# Patient Record
Sex: Male | Born: 1980 | Race: White | Hispanic: No | Marital: Married | Smoking: Never smoker
Health system: Southern US, Community
[De-identification: ages and names within clinical notes are randomized; demographics above are authoritative.]

---

## 2016-03-09 ENCOUNTER — Encounter: Payer: Self-pay | Admitting: Emergency Medicine

## 2016-03-09 ENCOUNTER — Emergency Department: Payer: 59

## 2016-03-09 ENCOUNTER — Emergency Department
Admission: EM | Admit: 2016-03-09 | Discharge: 2016-03-09 | Payer: 59 | Attending: Emergency Medicine | Admitting: Emergency Medicine

## 2016-03-09 DIAGNOSIS — S0990XA Unspecified injury of head, initial encounter: Secondary | ICD-10-CM | POA: Diagnosis not present

## 2016-03-09 DIAGNOSIS — S82831A Other fracture of upper and lower end of right fibula, initial encounter for closed fracture: Secondary | ICD-10-CM | POA: Diagnosis not present

## 2016-03-09 DIAGNOSIS — S82251A Displaced comminuted fracture of shaft of right tibia, initial encounter for closed fracture: Secondary | ICD-10-CM | POA: Insufficient documentation

## 2016-03-09 DIAGNOSIS — R1012 Left upper quadrant pain: Secondary | ICD-10-CM | POA: Diagnosis not present

## 2016-03-09 DIAGNOSIS — R0781 Pleurodynia: Secondary | ICD-10-CM | POA: Diagnosis not present

## 2016-03-09 DIAGNOSIS — Y92410 Unspecified street and highway as the place of occurrence of the external cause: Secondary | ICD-10-CM | POA: Insufficient documentation

## 2016-03-09 DIAGNOSIS — S82141A Displaced bicondylar fracture of right tibia, initial encounter for closed fracture: Secondary | ICD-10-CM

## 2016-03-09 DIAGNOSIS — Y999 Unspecified external cause status: Secondary | ICD-10-CM | POA: Diagnosis not present

## 2016-03-09 DIAGNOSIS — Y9389 Activity, other specified: Secondary | ICD-10-CM | POA: Diagnosis not present

## 2016-03-09 DIAGNOSIS — S8291XA Unspecified fracture of right lower leg, initial encounter for closed fracture: Secondary | ICD-10-CM | POA: Diagnosis present

## 2016-03-09 DIAGNOSIS — S82401A Unspecified fracture of shaft of right fibula, initial encounter for closed fracture: Secondary | ICD-10-CM

## 2016-03-09 LAB — COMPREHENSIVE METABOLIC PANEL
ALT: 20 U/L (ref 17–63)
AST: 29 U/L (ref 15–41)
Albumin: 4.8 g/dL (ref 3.5–5.0)
Alkaline Phosphatase: 45 U/L (ref 38–126)
Anion gap: 8 (ref 5–15)
BILIRUBIN TOTAL: 0.6 mg/dL (ref 0.3–1.2)
BUN: 17 mg/dL (ref 6–20)
CO2: 24 mmol/L (ref 22–32)
CREATININE: 1.13 mg/dL (ref 0.61–1.24)
Calcium: 9.6 mg/dL (ref 8.9–10.3)
Chloride: 106 mmol/L (ref 101–111)
GFR calc Af Amer: >60 mL/min (ref >60)
Glucose, Bld: 120 mg/dL — ABNORMAL HIGH (ref 65–99)
POTASSIUM: 3.4 mmol/L — AB (ref 3.5–5.1)
Sodium: 138 mmol/L (ref 135–145)
Total Protein: 7.6 g/dL (ref 6.5–8.1)

## 2016-03-09 LAB — CBC WITH DIFFERENTIAL/PLATELET
BASOS ABS: 0.1 10*3/uL (ref 0–0.1)
Basophils Relative: 0 %
Eosinophils Absolute: 0.1 10*3/uL (ref 0–0.7)
Eosinophils Relative: 0 %
HEMATOCRIT: 44 % (ref 40.0–52.0)
Hemoglobin: 15.1 g/dL (ref 13.0–18.0)
LYMPHS ABS: 2.2 10*3/uL (ref 1.0–3.6)
LYMPHS PCT: 14 %
MCH: 33.1 pg (ref 26.0–34.0)
MCHC: 34.3 g/dL (ref 32.0–36.0)
MCV: 96.4 fL (ref 80.0–100.0)
MONO ABS: 1.1 10*3/uL — AB (ref 0.2–1.0)
MONOS PCT: 7 %
NEUTROS ABS: 12.7 10*3/uL — AB (ref 1.4–6.5)
Neutrophils Relative %: 79 %
Platelets: 266 10*3/uL (ref 150–440)
RBC: 4.56 MIL/uL (ref 4.40–5.90)
RDW: 12.4 % (ref 11.5–14.5)
WBC: 16.2 10*3/uL — ABNORMAL HIGH (ref 3.8–10.6)

## 2016-03-09 LAB — TYPE AND SCREEN
ABO/RH(D): A POS
Antibody Screen: NEGATIVE

## 2016-03-09 LAB — LIPASE, BLOOD: LIPASE: 20 U/L (ref 11–51)

## 2016-03-09 MED ORDER — HYDROMORPHONE HCL 1 MG/ML IJ SOLN
1.0000 mg | Freq: Once | INTRAMUSCULAR | Status: AC
  Administered 2016-03-09: 1 mg via INTRAVENOUS
  Filled 2016-03-09: qty 1

## 2016-03-09 MED ORDER — ONDANSETRON HCL 4 MG/2ML IJ SOLN
4.0000 mg | Freq: Once | INTRAMUSCULAR | Status: AC
  Administered 2016-03-09: 4 mg via INTRAVENOUS
  Filled 2016-03-09: qty 2

## 2016-03-09 MED ORDER — MORPHINE SULFATE (PF) 4 MG/ML IV SOLN
INTRAVENOUS | Status: AC
  Administered 2016-03-09: 4 mg via INTRAVENOUS
  Filled 2016-03-09: qty 1

## 2016-03-09 MED ORDER — IOPAMIDOL (ISOVUE-300) INJECTION 61%
100.0000 mL | Freq: Once | INTRAVENOUS | Status: AC | PRN
  Administered 2016-03-09: 100 mL via INTRAVENOUS

## 2016-03-09 MED ORDER — HYDROMORPHONE HCL 1 MG/ML IJ SOLN
INTRAMUSCULAR | Status: AC
  Administered 2016-03-09: 0.5 mg via INTRAVENOUS
  Filled 2016-03-09: qty 1

## 2016-03-09 MED ORDER — SODIUM CHLORIDE 0.9 % IV BOLUS (SEPSIS)
1000.0000 mL | Freq: Once | INTRAVENOUS | Status: AC
  Administered 2016-03-09: 1000 mL via INTRAVENOUS

## 2016-03-09 MED ORDER — MORPHINE SULFATE (PF) 4 MG/ML IV SOLN
4.0000 mg | Freq: Once | INTRAVENOUS | Status: AC
  Administered 2016-03-09: 4 mg via INTRAVENOUS
  Filled 2016-03-09: qty 1

## 2016-03-09 MED ORDER — HYDROMORPHONE HCL 1 MG/ML IJ SOLN
0.5000 mg | Freq: Once | INTRAMUSCULAR | Status: AC
  Administered 2016-03-09: 0.5 mg via INTRAVENOUS

## 2016-03-09 MED ORDER — MORPHINE SULFATE (PF) 4 MG/ML IV SOLN
4.0000 mg | Freq: Once | INTRAVENOUS | Status: AC
  Administered 2016-03-09: 4 mg via INTRAVENOUS
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
## 2016-03-09 NOTE — ED Notes (Signed)
Splint applied
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
## 2024-06-17 ENCOUNTER — Emergency Department (HOSPITAL_COMMUNITY)

## 2024-06-17 ENCOUNTER — Other Ambulatory Visit: Payer: Self-pay

## 2024-06-17 ENCOUNTER — Emergency Department (HOSPITAL_COMMUNITY)
Admission: EM | Admit: 2024-06-17 | Discharge: 2024-06-17 | Disposition: A | Discharge status: Home / Self Care | Attending: Emergency Medicine | Admitting: Emergency Medicine

## 2024-06-17 ENCOUNTER — Encounter (HOSPITAL_COMMUNITY): Payer: Self-pay

## 2024-06-17 DIAGNOSIS — Z23 Encounter for immunization: Secondary | ICD-10-CM | POA: Insufficient documentation

## 2024-06-17 DIAGNOSIS — S65311A Laceration of deep palmar arch of right hand, initial encounter: Secondary | ICD-10-CM | POA: Diagnosis present

## 2024-06-17 DIAGNOSIS — Y92410 Unspecified street and highway as the place of occurrence of the external cause: Secondary | ICD-10-CM | POA: Insufficient documentation

## 2024-06-17 LAB — CBC WITH DIFFERENTIAL/PLATELET
Abs Immature Granulocytes: 0.06 K/uL (ref 0.00–0.07)
Basophils Absolute: 0.1 K/uL (ref 0.0–0.1)
Basophils Relative: 0 %
Eosinophils Absolute: 0.2 K/uL (ref 0.0–0.5)
Eosinophils Relative: 1 %
HCT: 43.2 % (ref 39.0–52.0)
Hemoglobin: 14.9 g/dL (ref 13.0–17.0)
Immature Granulocytes: 1 %
Lymphocytes Relative: 27 %
Lymphs Abs: 3.5 K/uL (ref 0.7–4.0)
MCH: 33.3 pg (ref 26.0–34.0)
MCHC: 34.5 g/dL (ref 30.0–36.0)
MCV: 96.4 fL (ref 80.0–100.0)
Monocytes Absolute: 1 K/uL (ref 0.1–1.0)
Monocytes Relative: 8 %
Neutro Abs: 8 K/uL — ABNORMAL HIGH (ref 1.7–7.7)
Neutrophils Relative %: 63 %
Platelets: 307 K/uL (ref 150–400)
RBC: 4.48 MIL/uL (ref 4.22–5.81)
RDW: 12 % (ref 11.5–15.5)
WBC: 12.8 K/uL — ABNORMAL HIGH (ref 4.0–10.5)
nRBC: 0 % (ref 0.0–0.2)

## 2024-06-17 LAB — COMPREHENSIVE METABOLIC PANEL WITH GFR
ALT: 20 U/L (ref 0–44)
AST: 23 U/L (ref 15–41)
Albumin: 3.9 g/dL (ref 3.5–5.0)
Alkaline Phosphatase: 42 U/L (ref 38–126)
Anion gap: 11 (ref 5–15)
BUN: 10 mg/dL (ref 6–20)
CO2: 23 mmol/L (ref 22–32)
Calcium: 8.5 mg/dL — ABNORMAL LOW (ref 8.9–10.3)
Chloride: 105 mmol/L (ref 98–111)
Creatinine, Ser: 0.93 mg/dL (ref 0.61–1.24)
GFR, Estimated: >60 mL/min (ref >60)
Glucose, Bld: 118 mg/dL — ABNORMAL HIGH (ref 70–99)
Potassium: 3.2 mmol/L — ABNORMAL LOW (ref 3.5–5.1)
Sodium: 139 mmol/L (ref 135–145)
Total Bilirubin: 0.5 mg/dL (ref 0.0–1.2)
Total Protein: 6.6 g/dL (ref 6.5–8.1)

## 2024-06-17 LAB — RAPID URINE DRUG SCREEN, HOSP PERFORMED
Amphetamines: NOT DETECTED
Barbiturates: NOT DETECTED
Benzodiazepines: NOT DETECTED
Cocaine: NOT DETECTED
Opiates: NOT DETECTED
Tetrahydrocannabinol: POSITIVE — AB

## 2024-06-17 LAB — ETHANOL: Alcohol, Ethyl (B): 161 mg/dL — ABNORMAL HIGH (ref <15)

## 2024-06-17 LAB — I-STAT CHEM 8, ED
BUN: 10 mg/dL (ref 6–20)
Calcium, Ion: 1.13 mmol/L — ABNORMAL LOW (ref 1.15–1.40)
Chloride: 104 mmol/L (ref 98–111)
Creatinine, Ser: 1.2 mg/dL (ref 0.61–1.24)
Glucose, Bld: 119 mg/dL — ABNORMAL HIGH (ref 70–99)
HCT: 45 % (ref 39.0–52.0)
Hemoglobin: 15.3 g/dL (ref 13.0–17.0)
Potassium: 3.2 mmol/L — ABNORMAL LOW (ref 3.5–5.1)
Sodium: 141 mmol/L (ref 135–145)
TCO2: 23 mmol/L (ref 22–32)

## 2024-06-17 LAB — I-STAT CG4 LACTIC ACID, ED: Lactic Acid, Venous: 2.8 mmol/L (ref 0.5–1.9)

## 2024-06-17 MED ORDER — TETANUS-DIPHTH-ACELL PERTUSSIS 5-2-15.5 LF-MCG/0.5 IM SUSP
0.5000 mL | Freq: Once | INTRAMUSCULAR | Status: AC
  Administered 2024-06-17: 0.5 mL via INTRAMUSCULAR
  Filled 2024-06-17: qty 0.5

## 2024-06-17 MED ORDER — ONDANSETRON HCL 4 MG/2ML IJ SOLN
4.0000 mg | Freq: Once | INTRAMUSCULAR | Status: AC
  Administered 2024-06-17: 4 mg via INTRAVENOUS
  Filled 2024-06-17: qty 2

## 2024-06-17 MED ORDER — LIDOCAINE-EPINEPHRINE (PF) 2 %-1:200000 IJ SOLN
10.0000 mL | Freq: Once | INTRAMUSCULAR | Status: AC
  Administered 2024-06-17: 10 mL
  Filled 2024-06-17: qty 20

## 2024-06-17 MED ORDER — CEFAZOLIN SODIUM-DEXTROSE 2-4 GM/100ML-% IV SOLN
2.0000 g | INTRAVENOUS | Status: AC
  Administered 2024-06-17: 2 g via INTRAVENOUS
  Filled 2024-06-17: qty 100

## 2024-06-17 MED ORDER — SODIUM CHLORIDE 0.9 % IV BOLUS
125.0000 mL | Freq: Once | INTRAVENOUS | Status: AC
Start: 2024-06-17 — End: 2024-06-17
  Administered 2024-06-17: 125 mL via INTRAVENOUS

## 2024-06-17 MED ORDER — CEPHALEXIN 500 MG PO CAPS: 500.0000 mg | ORAL_CAPSULE | Freq: Three times a day (TID) | ORAL | 0 refills | Status: AC

## 2024-06-17 MED ORDER — IOHEXOL 350 MG/ML SOLN
75.0000 mL | Freq: Once | INTRAVENOUS | Status: AC | PRN
  Administered 2024-06-17: 75 mL via INTRAVENOUS
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
## 2024-06-17 NOTE — ED Notes (Signed)
 Pt placed back on cardiac monitoring equipment after taken off by patient.
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
